# Patient Record
Sex: Female | Born: 2000 | Race: White | Hispanic: No | Marital: Single | Smoking: Never smoker
Health system: Southern US, Community
[De-identification: ages and names within clinical notes are randomized; demographics above are authoritative.]

## PROBLEM LIST (undated history)

## (undated) HISTORY — PX: TONSILLECTOMY AND ADENOIDECTOMY: SHX28

---
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
## 2020-11-15 ENCOUNTER — Institutional Professional Consult (permissible substitution): Payer: Self-pay | Admitting: Plastic Surgery

## 2021-02-24 ENCOUNTER — Encounter: Payer: Self-pay | Admitting: Plastic Surgery

## 2021-02-24 ENCOUNTER — Other Ambulatory Visit: Payer: Self-pay

## 2021-02-24 ENCOUNTER — Ambulatory Visit (INDEPENDENT_AMBULATORY_CARE_PROVIDER_SITE_OTHER): Payer: Medicaid Other | Admitting: Plastic Surgery

## 2021-02-24 DIAGNOSIS — M542 Cervicalgia: Secondary | ICD-10-CM | POA: Diagnosis not present

## 2021-02-24 DIAGNOSIS — M549 Dorsalgia, unspecified: Secondary | ICD-10-CM | POA: Insufficient documentation

## 2021-02-24 DIAGNOSIS — M546 Pain in thoracic spine: Secondary | ICD-10-CM | POA: Diagnosis not present

## 2021-02-24 DIAGNOSIS — N62 Hypertrophy of breast: Secondary | ICD-10-CM | POA: Diagnosis not present

## 2021-02-24 DIAGNOSIS — G8929 Other chronic pain: Secondary | ICD-10-CM

## 2021-02-24 NOTE — Progress Notes (Signed)
Patient ID: Ann Neal, female    DOB: 13-Aug-2001, 20 y.o.   MRN: [id number]   Chief Complaint  Patient presents with   Advice Only   Breast Problem    Mammary Hyperplasia: The patient is a 20 y.o. female with a history of mammary hyperplasia for several years.  She has extremely large breasts causing symptoms that include the following: Back pain in the upper and lower back, including neck pain. She pulls or pins her bra straps to provide better lift and relief of the pressure and pain. She notices relief by holding her breast up manually.  Her shoulder straps cause grooves and pain and pressure that requires padding for relief. Pain medication is sometimes required with motrin and tylenol.  Activities that are hindered by enlarged breasts include: exercise and running.  She has tried supportive clothing as well as fitted bras without improvement.  Her breasts are extremely large and fairly symmetric.  She has hyperpigmentation of the inframammary area on both sides.  The sternal to nipple distance on the right is 34 cm and the left is 34 cm.  The IMF distance is 12 cm.  She is 5 feet 3 inches tall and weighs 257 pounds.  The BMI = 45.5.  Preoperative bra size = the patient feels she is an H cup.  I would place her at a DDD cup size. The estimated excess breast tissue to be removed at the time of surgery = 650 grams on the left and 650 grams on the right.  Mammogram history: Not indicated.  Family history of breast cancer: Maternal great grandmother.  Tobacco use: None.   The patient expresses the desire to pursue surgical intervention.  Patient is planning on losing 100 more pounds.    Review of Systems  Constitutional: Negative.   HENT: Negative.    Eyes: Negative.   Respiratory: Negative.    Cardiovascular: Negative.   Gastrointestinal: Negative.   Endocrine: Negative.   Genitourinary: Negative.   Musculoskeletal:  Positive for back pain and neck pain.  Skin:  Positive for color  change.  Hematological: Negative.   Psychiatric/Behavioral: Negative.     History reviewed. No pertinent past medical history.  Past Surgical History:  Procedure Laterality Date   TONSILLECTOMY AND ADENOIDECTOMY        Current Outpatient Medications:    clotrimazole-betamethasone (LOTRISONE) cream, Apply to affected area 2 times daily, Disp: , Rfl:    etonogestrel (NEXPLANON) 68 MG IMPL implant, Inject into the skin., Disp: , Rfl:    phentermine (ADIPEX-P) 37.5 MG tablet, Take 18.75-37.5 mg by mouth every morning., Disp: , Rfl:    SUMAtriptan (IMITREX) 50 MG tablet, Take 1 tablet at onset of migraine along with Ibuprofen 400mg . May repeat in 2 hours if needed (Patient not taking: Reported on 02/24/2021), Disp: 10 tablet, Rfl: 3   Objective:   Vitals:   02/24/21 1337  BP: 117/83  Pulse: 93  SpO2: 98%    Physical Exam Vitals and nursing note reviewed.  Constitutional:      Appearance: Normal appearance.  HENT:     Head: Normocephalic and atraumatic.  Cardiovascular:     Rate and Rhythm: Normal rate.     Pulses: Normal pulses.  Pulmonary:     Effort: Pulmonary effort is normal. No respiratory distress.  Abdominal:     General: Abdomen is flat. There is no distension.     Palpations: Abdomen is soft. There is no mass.  Musculoskeletal:  General: No swelling or deformity.  Skin:    General: Skin is warm.     Capillary Refill: Capillary refill takes less than 2 seconds.     Coloration: Skin is not jaundiced.  Neurological:     General: No focal deficit present.     Mental Status: She is alert and oriented to person, place, and time.    Assessment & Plan:  Chronic bilateral thoracic back pain  Neck pain  Symptomatic mammary hypertrophy  The procedure the patient selected and that was best for the patient was discussed. The risk were discussed and include but not limited to the following:  Breast asymmetry, fluid accumulation, firmness of the breast, inability to  breast feed, loss of nipple or areola, skin loss, change in skin and nipple sensation, fat necrosis of the breast tissue, bleeding, infection and healing delay.  There are risks of anesthesia and injury to nerves or blood vessels.  Allergic reaction to tape, suture and skin glue are possible.  There will be swelling.  Any of these can lead to the need for revisional surgery.  A breast reduction has potential to interfere with diagnostic procedures in the future.  This procedure is best done when the breast is fully developed.  Changes in the breast will continue to occur over time: pregnancy, weight gain or weigh loss.    Total time: 45 minutes. This includes time spent with the patient during the visit as well as time spent before and after the visit reviewing the chart, documenting the encounter and ordering pertinent studies. and literature emailed to the patient.   Physical therapy: Completed and we will get the records Mammogram: Not indicated Healthy Weight and Wellness: Is currently on medication for weight loss and has already dropped 15 pounds. Encouraged the patient to reach her ideal weight prior to surgery.  Recommend follow-up in 6 months.  We will email her information and look forward to seeing her back.  Pictures were obtained of the patient and placed in the chart with the patient's or guardian's permission.    Alena Bills [place] Gavitt, DO

## 2022-12-14 ENCOUNTER — Institutional Professional Consult (permissible substitution): Payer: Medicaid Other | Admitting: Nurse Practitioner

## 2022-12-19 ENCOUNTER — Encounter: Payer: Self-pay | Admitting: Nurse Practitioner

## 2023-01-09 ENCOUNTER — Institutional Professional Consult (permissible substitution): Payer: Medicaid Other | Admitting: Adult Health

## 2023-08-01 ENCOUNTER — Other Ambulatory Visit (HOSPITAL_BASED_OUTPATIENT_CLINIC_OR_DEPARTMENT_OTHER): Payer: Self-pay

## 2023-08-01 MED ORDER — WEGOVY 0.25 MG/0.5ML ~~LOC~~ SOAJ
0.2500 mg | SUBCUTANEOUS | 0 refills | Status: DC
  Filled 2023-08-01: qty 2, 28d supply, fill #0

## 2023-08-02 ENCOUNTER — Other Ambulatory Visit (HOSPITAL_BASED_OUTPATIENT_CLINIC_OR_DEPARTMENT_OTHER): Payer: Self-pay

## 2023-08-26 ENCOUNTER — Other Ambulatory Visit (HOSPITAL_BASED_OUTPATIENT_CLINIC_OR_DEPARTMENT_OTHER): Payer: Self-pay

## 2023-08-26 MED ORDER — WEGOVY 0.5 MG/0.5ML ~~LOC~~ SOAJ
0.5000 mg | SUBCUTANEOUS | 0 refills | Status: DC
  Filled 2023-08-26: qty 2, 28d supply, fill #0

## 2023-09-29 ENCOUNTER — Other Ambulatory Visit (HOSPITAL_BASED_OUTPATIENT_CLINIC_OR_DEPARTMENT_OTHER): Payer: Self-pay

## 2023-09-29 MED ORDER — WEGOVY 1 MG/0.5ML ~~LOC~~ SOAJ
1.0000 mg | SUBCUTANEOUS | 1 refills | Status: AC
  Filled 2023-09-29: qty 2, 28d supply, fill #0
  Filled 2023-11-14: qty 2, 28d supply, fill #1

## 2023-10-07 ENCOUNTER — Other Ambulatory Visit (HOSPITAL_BASED_OUTPATIENT_CLINIC_OR_DEPARTMENT_OTHER): Payer: Self-pay

## 2023-11-14 ENCOUNTER — Other Ambulatory Visit (HOSPITAL_BASED_OUTPATIENT_CLINIC_OR_DEPARTMENT_OTHER): Payer: Self-pay

## 2023-11-20 ENCOUNTER — Other Ambulatory Visit (HOSPITAL_BASED_OUTPATIENT_CLINIC_OR_DEPARTMENT_OTHER): Payer: Self-pay

## 2023-11-23 ENCOUNTER — Other Ambulatory Visit (HOSPITAL_BASED_OUTPATIENT_CLINIC_OR_DEPARTMENT_OTHER): Payer: Self-pay

## 2023-12-23 ENCOUNTER — Other Ambulatory Visit: Payer: Self-pay

## 2023-12-23 ENCOUNTER — Other Ambulatory Visit (HOSPITAL_BASED_OUTPATIENT_CLINIC_OR_DEPARTMENT_OTHER): Payer: Self-pay

## 2023-12-23 MED ORDER — WEGOVY 1.7 MG/0.75ML ~~LOC~~ SOAJ
1.7000 mg | SUBCUTANEOUS | 1 refills | Status: DC
  Filled 2023-12-23: qty 6, 84d supply, fill #0

## 2024-01-13 ENCOUNTER — Other Ambulatory Visit (HOSPITAL_BASED_OUTPATIENT_CLINIC_OR_DEPARTMENT_OTHER): Payer: Self-pay

## 2024-01-13 MED ORDER — ONDANSETRON 8 MG PO TBDP
8.0000 mg | ORAL_TABLET | Freq: Three times a day (TID) | ORAL | 1 refills | Status: AC | PRN
  Filled 2024-01-13: qty 20, 7d supply, fill #0

## 2024-01-13 MED ORDER — ZEPBOUND 2.5 MG/0.5ML ~~LOC~~ SOAJ
2.5000 mg | SUBCUTANEOUS | 0 refills | Status: DC
  Filled 2024-01-13: qty 2, 28d supply, fill #0

## 2024-02-11 ENCOUNTER — Other Ambulatory Visit (HOSPITAL_BASED_OUTPATIENT_CLINIC_OR_DEPARTMENT_OTHER): Payer: Self-pay

## 2024-02-11 MED ORDER — ZEPBOUND 5 MG/0.5ML ~~LOC~~ SOAJ
5.0000 mg | SUBCUTANEOUS | 1 refills | Status: DC
  Filled 2024-02-11: qty 2, 28d supply, fill #0

## 2024-03-16 ENCOUNTER — Other Ambulatory Visit (HOSPITAL_BASED_OUTPATIENT_CLINIC_OR_DEPARTMENT_OTHER): Payer: Self-pay

## 2024-03-16 MED ORDER — PANTOPRAZOLE SODIUM 40 MG PO TBEC
40.0000 mg | DELAYED_RELEASE_TABLET | Freq: Every day | ORAL | 1 refills | Status: AC
  Filled 2024-03-16: qty 90, 90d supply, fill #0

## 2024-03-16 MED ORDER — VITAMIN D (ERGOCALCIFEROL) 1.25 MG (50000 UNIT) PO CAPS
50000.0000 [IU] | ORAL_CAPSULE | ORAL | 1 refills | Status: AC
  Filled 2024-03-16: qty 24, 84d supply, fill #0

## 2024-03-16 MED ORDER — DOXYCYCLINE HYCLATE 100 MG PO TABS
100.0000 mg | ORAL_TABLET | Freq: Two times a day (BID) | ORAL | 0 refills | Status: AC
  Filled 2024-03-16: qty 14, 7d supply, fill #0

## 2024-03-16 MED ORDER — MUPIROCIN 2 % EX OINT
TOPICAL_OINTMENT | Freq: Every day | CUTANEOUS | 0 refills | Status: AC
  Filled 2024-03-16: qty 22, 30d supply, fill #0

## 2024-03-30 ENCOUNTER — Other Ambulatory Visit (HOSPITAL_BASED_OUTPATIENT_CLINIC_OR_DEPARTMENT_OTHER): Payer: Self-pay

## 2024-03-30 MED ORDER — ZEPBOUND 7.5 MG/0.5ML ~~LOC~~ SOAJ
7.5000 mg | SUBCUTANEOUS | 1 refills | Status: AC
  Filled 2024-03-30: qty 2, 28d supply, fill #0

## 2024-05-01 ENCOUNTER — Other Ambulatory Visit (HOSPITAL_BASED_OUTPATIENT_CLINIC_OR_DEPARTMENT_OTHER): Payer: Self-pay

## 2024-05-01 MED ORDER — DOXYCYCLINE HYCLATE 100 MG PO TABS
100.0000 mg | ORAL_TABLET | Freq: Two times a day (BID) | ORAL | 0 refills | Status: AC
  Filled 2024-05-01: qty 14, 7d supply, fill #0

## 2024-05-11 ENCOUNTER — Other Ambulatory Visit (HOSPITAL_BASED_OUTPATIENT_CLINIC_OR_DEPARTMENT_OTHER): Payer: Self-pay

## 2024-05-13 ENCOUNTER — Other Ambulatory Visit (HOSPITAL_BASED_OUTPATIENT_CLINIC_OR_DEPARTMENT_OTHER): Payer: Self-pay

## 2024-05-13 ENCOUNTER — Other Ambulatory Visit: Payer: Self-pay

## 2024-05-13 MED ORDER — ZEPBOUND 10 MG/0.5ML ~~LOC~~ SOAJ
10.0000 mg | SUBCUTANEOUS | 0 refills | Status: DC
  Filled 2024-05-13: qty 2, 28d supply, fill #0

## 2024-06-22 ENCOUNTER — Encounter (HOSPITAL_BASED_OUTPATIENT_CLINIC_OR_DEPARTMENT_OTHER): Payer: Self-pay

## 2024-06-22 ENCOUNTER — Other Ambulatory Visit (HOSPITAL_BASED_OUTPATIENT_CLINIC_OR_DEPARTMENT_OTHER): Payer: Self-pay

## 2024-06-22 MED ORDER — ZEPBOUND 12.5 MG/0.5ML ~~LOC~~ SOAJ
12.5000 mg | SUBCUTANEOUS | 1 refills | Status: AC
  Filled 2024-06-22 – 2024-09-10 (×3): qty 2, 28d supply, fill #0

## 2024-06-25 ENCOUNTER — Other Ambulatory Visit (HOSPITAL_BASED_OUTPATIENT_CLINIC_OR_DEPARTMENT_OTHER): Payer: Self-pay

## 2024-06-25 MED ORDER — VALACYCLOVIR HCL 500 MG PO TABS
500.0000 mg | ORAL_TABLET | Freq: Two times a day (BID) | ORAL | 0 refills | Status: AC
  Filled 2024-06-25: qty 10, 5d supply, fill #0

## 2024-08-31 ENCOUNTER — Other Ambulatory Visit (HOSPITAL_BASED_OUTPATIENT_CLINIC_OR_DEPARTMENT_OTHER): Payer: Self-pay

## 2024-08-31 MED ORDER — WEGOVY 0.25 MG/0.5ML ~~LOC~~ SOAJ
0.2500 mg | SUBCUTANEOUS | 0 refills | Status: AC
  Filled 2024-08-31 – 2024-09-23 (×3): qty 2, 28d supply, fill #0

## 2024-09-03 ENCOUNTER — Other Ambulatory Visit (HOSPITAL_BASED_OUTPATIENT_CLINIC_OR_DEPARTMENT_OTHER): Payer: Self-pay

## 2024-09-10 ENCOUNTER — Other Ambulatory Visit (HOSPITAL_BASED_OUTPATIENT_CLINIC_OR_DEPARTMENT_OTHER): Payer: Self-pay

## 2024-09-12 ENCOUNTER — Other Ambulatory Visit (HOSPITAL_BASED_OUTPATIENT_CLINIC_OR_DEPARTMENT_OTHER): Payer: Self-pay

## 2024-09-14 ENCOUNTER — Other Ambulatory Visit (HOSPITAL_BASED_OUTPATIENT_CLINIC_OR_DEPARTMENT_OTHER): Payer: Self-pay

## 2024-09-15 ENCOUNTER — Other Ambulatory Visit: Payer: Self-pay

## 2024-09-16 ENCOUNTER — Other Ambulatory Visit (HOSPITAL_BASED_OUTPATIENT_CLINIC_OR_DEPARTMENT_OTHER): Payer: Self-pay

## 2024-09-18 ENCOUNTER — Other Ambulatory Visit (HOSPITAL_BASED_OUTPATIENT_CLINIC_OR_DEPARTMENT_OTHER): Payer: Self-pay

## 2024-09-23 ENCOUNTER — Other Ambulatory Visit (HOSPITAL_BASED_OUTPATIENT_CLINIC_OR_DEPARTMENT_OTHER): Payer: Self-pay
